# Patient Record
Sex: Male | Born: 1960
Health system: Southern US, Community
[De-identification: ages and names within clinical notes are randomized; demographics above are authoritative.]

## PROBLEM LIST (undated history)

## (undated) DIAGNOSIS — I10 Essential (primary) hypertension: Secondary | ICD-10-CM

## (undated) HISTORY — PX: GASTRIC BYPASS: SHX52

## (undated) HISTORY — PX: CHOLECYSTECTOMY: SHX55

---

## 2015-07-17 DIAGNOSIS — M25531 Pain in right wrist: Secondary | ICD-10-CM | POA: Insufficient documentation

## 2015-07-17 DIAGNOSIS — M7581 Other shoulder lesions, right shoulder: Secondary | ICD-10-CM | POA: Insufficient documentation

## 2015-07-17 DIAGNOSIS — M170 Bilateral primary osteoarthritis of knee: Secondary | ICD-10-CM | POA: Insufficient documentation

## 2015-07-17 DIAGNOSIS — G894 Chronic pain syndrome: Secondary | ICD-10-CM | POA: Insufficient documentation

## 2015-08-17 DIAGNOSIS — I10 Essential (primary) hypertension: Secondary | ICD-10-CM | POA: Insufficient documentation

## 2015-10-09 ENCOUNTER — Encounter (HOSPITAL_BASED_OUTPATIENT_CLINIC_OR_DEPARTMENT_OTHER): Payer: Self-pay | Admitting: *Deleted

## 2015-10-09 ENCOUNTER — Emergency Department (HOSPITAL_BASED_OUTPATIENT_CLINIC_OR_DEPARTMENT_OTHER)
Admission: EM | Admit: 2015-10-09 | Discharge: 2015-10-09 | Disposition: A | Discharge status: Home / Self Care | Payer: Managed Care, Other (non HMO) | Attending: Emergency Medicine | Admitting: Emergency Medicine

## 2015-10-09 ENCOUNTER — Emergency Department (HOSPITAL_BASED_OUTPATIENT_CLINIC_OR_DEPARTMENT_OTHER): Payer: Managed Care, Other (non HMO)

## 2015-10-09 DIAGNOSIS — R002 Palpitations: Secondary | ICD-10-CM | POA: Insufficient documentation

## 2015-10-09 DIAGNOSIS — R072 Precordial pain: Secondary | ICD-10-CM | POA: Diagnosis present

## 2015-10-09 DIAGNOSIS — I10 Essential (primary) hypertension: Secondary | ICD-10-CM | POA: Insufficient documentation

## 2015-10-09 DIAGNOSIS — Z79899 Other long term (current) drug therapy: Secondary | ICD-10-CM | POA: Insufficient documentation

## 2015-10-09 HISTORY — DX: Essential (primary) hypertension: I10

## 2015-10-09 LAB — CBC
HEMATOCRIT: 32.3 % — AB (ref 39.0–52.0)
HEMOGLOBIN: 10.2 g/dL — AB (ref 13.0–17.0)
MCH: 22.4 pg — ABNORMAL LOW (ref 26.0–34.0)
MCHC: 31.6 g/dL (ref 30.0–36.0)
MCV: 71 fL — AB (ref 78.0–100.0)
Platelets: 278 10*3/uL (ref 150–400)
RBC: 4.55 MIL/uL (ref 4.22–5.81)
RDW: 16.4 % — ABNORMAL HIGH (ref 11.5–15.5)
WBC: 4.4 10*3/uL (ref 4.0–10.5)

## 2015-10-09 LAB — BASIC METABOLIC PANEL
ANION GAP: 6 (ref 5–15)
BUN: 18 mg/dL (ref 6–20)
CHLORIDE: 109 mmol/L (ref 101–111)
CO2: 24 mmol/L (ref 22–32)
Calcium: 8.7 mg/dL — ABNORMAL LOW (ref 8.9–10.3)
Creatinine, Ser: 1.06 mg/dL (ref 0.61–1.24)
GFR calc Af Amer: >60 mL/min (ref >60)
GLUCOSE: 104 mg/dL — AB (ref 65–99)
POTASSIUM: 3.1 mmol/L — AB (ref 3.5–5.1)
Sodium: 139 mmol/L (ref 135–145)

## 2015-10-09 LAB — TROPONIN I: Troponin I: <0.03 ng/mL (ref <0.031)

## 2015-10-09 MED ORDER — GI COCKTAIL ~~LOC~~
30.0000 mL | Freq: Once | ORAL | Status: AC
  Administered 2015-10-09: 30 mL via ORAL
  Filled 2015-10-09: qty 30

## 2015-10-09 MED ORDER — POTASSIUM CHLORIDE CRYS ER 20 MEQ PO TBCR
40.0000 meq | EXTENDED_RELEASE_TABLET | Freq: Once | ORAL | Status: AC
  Administered 2015-10-09: 40 meq via ORAL
  Filled 2015-10-09: qty 2

## 2015-10-09 NOTE — ED Notes (Addendum)
Pt reports CP, heart palpitations, diaphoresis, SOB and left arm pain all day today.  Reports taking acid reducers without relief.  Pt in obvious pain in triage.

## 2015-10-09 NOTE — ED Provider Notes (Signed)
CSN: [id number]     Arrival date & time 10/09/15  0020 History   First MD Initiated Contact with Patient 10/09/15 [phone]     Chief Complaint  Patient presents with  . Chest Pain     (Consider location/radiation/quality/duration/timing/severity/associated sxs/prior Treatment) Patient is a 55 y.o. male presenting with chest pain and palpitations. The history is provided by the patient.  Chest Pain Pain location:  Substernal area Pain quality: tightness   Pain radiates to:  Does not radiate Pain radiates to the back: no   Pain severity:  Moderate Onset quality:  Gradual Duration:  1 day Timing:  Constant Progression:  Waxing and waning Chronicity:  New Context: stress   Context: not breathing and no trauma   Relieved by:  Nothing Worsened by:  Nothing tried Ineffective treatments:  None tried Associated symptoms: palpitations   Associated symptoms: no cough, no dysphagia, no fever, no heartburn, no lower extremity edema, no near-syncope, no PND, not vomiting and no weakness   Risk factors: no aortic disease   Palpitations Palpitations quality:  Fast Onset quality:  Gradual Timing:  Constant Progression:  Unchanged Chronicity:  New Context: anxiety   Context: not appetite suppressants and not illicit drugs   Relieved by:  Nothing Worsened by:  Nothing Ineffective treatments:  None tried Associated symptoms: chest pain   Associated symptoms: no cough, no lower extremity edema, no near-syncope, no PND, no vomiting and no weakness   Risk factors: stress   Risk factors: no hx of PE     Past Medical History  Diagnosis Date  . Hypertension    Past Surgical History  Procedure Laterality Date  . Gastric bypass    . Cholecystectomy     History reviewed. No pertinent family history. Social History  Substance Use Topics  . Smoking status: Never Smoker   . Smokeless tobacco: None  . Alcohol Use: No    Review of Systems  Constitutional: Negative for fever.  HENT:  Negative for trouble swallowing.   Respiratory: Negative for cough and wheezing.   Cardiovascular: Positive for chest pain and palpitations. Negative for leg swelling, PND and near-syncope.  Gastrointestinal: Negative for heartburn and vomiting.  Neurological: Negative for weakness.  All other systems reviewed and are negative.     Allergies  Aspirin; Biaxin; Ibuprofen; and Lotensin  Home Medications   Prior to Admission medications   Medication Sig Start Date End Date Taking? Authorizing Provider  alprazolam Prudy Feeler) 2 MG tablet Take 2 mg by mouth at bedtime as needed for sleep.   Yes Historical Provider, MD  HYDROcodone-acetaminophen (NORCO/VICODIN) 5-325 MG tablet Take 1 tablet by mouth every 6 (six) hours as needed for moderate pain.   Yes Historical Provider, MD  losartan-hydrochlorothiazide (HYZAAR) 100-25 MG tablet Take 1 tablet by mouth daily.   Yes Historical Provider, MD  tiZANidine (ZANAFLEX) 4 MG tablet Take 4 mg by mouth every 6 (six) hours as needed for muscle spasms.   Yes Historical Provider, MD   BP 147/95 mmHg  Pulse 60  Temp(Src) 98.3 F (36.8 C) (Oral)  Resp 20  Ht  (1.88 m)  Wt 340 lb (154.223 kg)  BMI 43.63 kg/m2  SpO2 100% Physical Exam  Constitutional: He is oriented to person, place, and time. He appears well-developed and well-nourished.  HENT:  Head: Normocephalic and atraumatic.  Mouth/Throat: Oropharynx is clear and moist.  Eyes: Conjunctivae are normal. Pupils are equal, round, and reactive to light.  Neck: Normal range of motion. Neck supple.  Cardiovascular: Normal rate, regular rhythm and intact distal pulses.   Pulmonary/Chest: Effort normal and breath sounds normal. No respiratory distress. He has no wheezes. He has no rales.  Abdominal: Soft. Bowel sounds are normal. There is no tenderness. There is no rebound and no guarding.  Musculoskeletal: Normal range of motion. He exhibits no edema or tenderness.  Neurological: He is alert and  oriented to person, place, and time. He has normal reflexes.  Skin: Skin is warm and dry. He is not diaphoretic.  Psychiatric: His mood appears anxious.    ED Course  Procedures (including critical care time) Labs Review Labs Reviewed  BASIC METABOLIC PANEL  CBC  TROPONIN I    Imaging Review Dg Chest 2 View  10/09/2015  CLINICAL DATA:  Chest pain EXAM: CHEST  2 VIEW COMPARISON:  None. FINDINGS: Normal heart size and mediastinal contours. No acute infiltrate or edema. Cluster of calcified granulomas suspected in the right mid lung. No effusion or pneumothorax. No acute osseous findings. IMPRESSION: No evidence of acute cardiopulmonary disease. Electronically Signed   By: Marnee Spring M.D.   On: 10/09/2015 00:59   I have personally reviewed and evaluated these images and lab results as part of my medical decision-making.   EKG Interpretation   Date/Time:  Monday October 09 2015 00:26:19 EDT Ventricular Rate:  72 PR Interval:  168 QRS Duration: 114 QT Interval:  412 QTC Calculation: 451 R Axis:   49 Text Interpretation:  Sinus rhythm with Premature atrial complexes Minimal  voltage criteria for LVH, may be normal variant Confirmed by [facility]   MD, Kobe Ofallon ([id number]) on 10/09/2015 1:14:31 AM      MDM   Final diagnoses:  None   Filed Vitals:   10/09/15 0415 10/09/15 0430  BP: 126/84 128/91  Pulse: 64 62  Temp:    Resp:      Results for orders placed or performed during the hospital encounter of 10/09/15  Basic metabolic panel  Result Value Ref Range   Sodium 139 135 - 145 mmol/L   Potassium 3.1 (L) 3.5 - 5.1 mmol/L   Chloride 109 101 - 111 mmol/L   CO2 24 22 - 32 mmol/L   Glucose, Bld 104 (H) 65 - 99 mg/dL   BUN 18 6 - 20 mg/dL   Creatinine, Ser 4.09 0.61 - 1.24 mg/dL   Calcium 8.7 (L) 8.9 - 10.3 mg/dL   GFR calc non Af Amer >60 >60 mL/min   GFR calc Af Amer >60 >60 mL/min   Anion gap 6 5 - 15  CBC  Result Value Ref Range   WBC 4.4 4.0 - 10.5 K/uL   RBC 4.55  4.22 - 5.81 MIL/uL   Hemoglobin 10.2 (L) 13.0 - 17.0 g/dL   HCT 81.1 (L) 91.4 - 78.2 %   MCV 71.0 (L) 78.0 - 100.0 fL   MCH 22.4 (L) 26.0 - 34.0 pg   MCHC 31.6 30.0 - 36.0 g/dL   RDW 95.6 (H) 21.3 - 08.6 %   Platelets 278 150 - 400 K/uL  Troponin I  Result Value Ref Range   Troponin I <0.03 <0.031 ng/mL  Troponin I  Result Value Ref Range   Troponin I <0.03 <0.031 ng/mL   Dg Chest 2 View  10/09/2015  CLINICAL DATA:  Chest pain EXAM: CHEST  2 VIEW COMPARISON:  None. FINDINGS: Normal heart size and mediastinal contours. No acute infiltrate or edema. Cluster of calcified granulomas suspected in the right mid lung. No effusion or  pneumothorax. No acute osseous findings. IMPRESSION: No evidence of acute cardiopulmonary disease. Electronically Signed   By: Marnee SpringJonathon  Watts M.D.   On: 10/09/2015 00:59    PERC negative wells 0 highly doubt PE.  Suspect stress and anxiety as HR is not fast on exam nor at all on the monitor during visit.  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  Ruled out for MI with 2 negative troponins and EKG.  HEART score is 1 and patient is stable for discharge with close follow up.  Strict return precautions given    Shadee Rathod, MD 10/09/15 [phone]

## 2015-10-09 NOTE — Discharge Instructions (Signed)
Holter Monitoring A Holter monitor is a small device that is used to detect abnormal heart rhythms. It clips to your clothing and is connected by wires to flat, sticky disks (electrodes) that attach to your chest. It is worn continuously for 24-48 hours. HOME CARE INSTRUCTIONS  Wear your Holter monitor at all times, even while exercising and sleeping, for as long as directed by your health care provider.  Make sure that the Holter monitor is safely clipped to your clothing or close to your body as recommended by your health care provider.  Do not get the monitor or wires wet.  Do not put body lotion or moisturizer on your chest.  Keep your skin clean.  Keep a diary of your daily activities, such as walking and doing chores. If you feel that your heartbeat is abnormal or that your heart is fluttering or skipping a beat:  Record what you are doing when it happens.  Record what time of day the symptoms occur.  Return your Holter monitor as directed by your health care provider.  Keep all follow-up visits as directed by your health care provider. This is important. SEEK IMMEDIATE MEDICAL CARE IF:  You feel lightheaded or you faint.  You have trouble breathing.  You feel pain in your chest, upper arm, or jaw.  You feel sick to your stomach and your skin is pale, cool, or damp.  You heartbeat feels unusual or abnormal.   This information is not intended to replace advice given to you by your health care provider. Make sure you discuss any questions you have with your health care provider.   Document Released: 01/19/2004 Document Revised: 05/13/2014 Document Reviewed: 11/29/2013 Elsevier Interactive Patient Education 2016 [employer]  

## 2015-10-09 NOTE — ED Notes (Signed)
Pt not in room, pt in b/r.  

## 2015-10-10 DIAGNOSIS — M5416 Radiculopathy, lumbar region: Secondary | ICD-10-CM | POA: Insufficient documentation

## 2015-10-10 DIAGNOSIS — M5412 Radiculopathy, cervical region: Secondary | ICD-10-CM | POA: Insufficient documentation

## 2015-10-13 DIAGNOSIS — D509 Iron deficiency anemia, unspecified: Secondary | ICD-10-CM | POA: Insufficient documentation

## 2015-10-13 DIAGNOSIS — E876 Hypokalemia: Secondary | ICD-10-CM | POA: Insufficient documentation

## 2015-10-13 DIAGNOSIS — R0789 Other chest pain: Secondary | ICD-10-CM | POA: Insufficient documentation

## 2015-10-23 DIAGNOSIS — R002 Palpitations: Secondary | ICD-10-CM | POA: Insufficient documentation

## 2015-11-29 ENCOUNTER — Ambulatory Visit: Payer: Self-pay | Admitting: Allergy and Immunology

## 2015-12-05 DIAGNOSIS — E559 Vitamin D deficiency, unspecified: Secondary | ICD-10-CM | POA: Insufficient documentation

## 2015-12-11 ENCOUNTER — Emergency Department (HOSPITAL_BASED_OUTPATIENT_CLINIC_OR_DEPARTMENT_OTHER)
Admission: EM | Admit: 2015-12-11 | Discharge: 2015-12-11 | Disposition: A | Discharge status: Home / Self Care | Payer: Managed Care, Other (non HMO) | Attending: Emergency Medicine | Admitting: Emergency Medicine

## 2015-12-11 ENCOUNTER — Encounter (HOSPITAL_BASED_OUTPATIENT_CLINIC_OR_DEPARTMENT_OTHER): Payer: Self-pay | Admitting: Emergency Medicine

## 2015-12-11 ENCOUNTER — Emergency Department (HOSPITAL_BASED_OUTPATIENT_CLINIC_OR_DEPARTMENT_OTHER): Payer: Managed Care, Other (non HMO)

## 2015-12-11 DIAGNOSIS — Z79899 Other long term (current) drug therapy: Secondary | ICD-10-CM | POA: Diagnosis not present

## 2015-12-11 DIAGNOSIS — I10 Essential (primary) hypertension: Secondary | ICD-10-CM | POA: Diagnosis not present

## 2015-12-11 DIAGNOSIS — M25561 Pain in right knee: Secondary | ICD-10-CM | POA: Insufficient documentation

## 2015-12-11 MED ORDER — PREDNISONE 10 MG PO TABS: 20.0000 mg | ORAL_TABLET | Freq: Two times a day (BID) | ORAL | 0 refills | Status: AC

## 2015-12-11 MED ORDER — PREDNISONE 50 MG PO TABS
60.0000 mg | ORAL_TABLET | Freq: Once | ORAL | Status: AC
  Administered 2015-12-11: 60 mg via ORAL
  Filled 2015-12-11: qty 1

## 2015-12-11 MED FILL — predniSONE 10 MG TABS: 10 | 5 days supply | Qty: 20 | Fill #0

## 2015-12-11 NOTE — Discharge Instructions (Signed)
You have been seen today for knee pain. Your imaging showed no acute abnormalities, rather than showed arthritic changes that are likely chronic. Follow-up with orthopedics as soon as possible. Follow up with PCP as needed. Return to ED as needed.

## 2015-12-11 NOTE — ED Notes (Signed)
Patient transported to X-ray 

## 2015-12-11 NOTE — ED Triage Notes (Signed)
Patient states that he is having pain to his right knee when he was running and he heard a pop. The patient reports that this was 1 week ago

## 2015-12-11 NOTE — ED Provider Notes (Signed)
[facility]   CSN: [id number] Arrival date & time: 12/11/15  1437  First Provider Contact:  First MD Initiated Contact with Patient 12/11/15 1600        History   Chief Complaint Chief Complaint  Patient presents with  . Leg Pain    HPI Michael Raymond is a 55 y.o. male.  HPI   Michael Raymond is a 55 y.o. male, with a history of chronic bilateral knee pain, presenting to the ED with anterior right knee pain. Moderate to severe, burning, nonradiating. Patient states that he has been trying to lose weight, was running last week, and heard a "pop." Pain is worse while lying down. Has tried his vicodin, voltaren gel, and gabapentin. Saw PCP day afterward. Duplex ultrasound and labs ok. Patient adds that he has had multiple arthroscopy procedures to the knee in question. Has been told that he needs a knee replacement but his surgeon wants to wait until the patient is at least 60. Has a pain mgt specialist. Does not want pain meds. Patient denies fever/chills, neuro deficits, swelling, or any other complaints.     Past Medical History:  Diagnosis Date  . Hypertension     There are no active problems to display for this patient.   Past Surgical History:  Procedure Laterality Date  . CHOLECYSTECTOMY    . GASTRIC BYPASS         Home Medications    Prior to Admission medications   Medication Sig Start Date End Date Taking? Authorizing Provider  alprazolam Prudy Feeler(XANAX) 2 MG tablet Take 2 mg by mouth at bedtime as needed for sleep.    Historical Provider, MD  HYDROcodone-acetaminophen (NORCO/VICODIN) 5-325 MG tablet Take 1 tablet by mouth every 6 (six) hours as needed for moderate pain.    Historical Provider, MD  losartan-hydrochlorothiazide (HYZAAR) 100-25 MG tablet Take 1 tablet by mouth daily.    Historical Provider, MD  predniSONE (DELTASONE) 10 MG tablet Take 2 tablets (20 mg total) by mouth 2 (two) times daily with a meal. 12/11/15   [place] Petron C Jamel Dunton, PA-C    tiZANidine (ZANAFLEX) 4 MG tablet Take 4 mg by mouth every 6 (six) hours as needed for muscle spasms.    Historical Provider, MD    Family History History reviewed. No pertinent family history.  Social History Social History  Substance Use Topics  . Smoking status: Never Smoker  . Smokeless tobacco: Never Used  . Alcohol use No     Allergies   Aspirin; Biaxin [clarithromycin]; Ibuprofen; and Lotensin [benazepril hcl]   Review of Systems Review of Systems  Constitutional: Negative for chills and fever.  Musculoskeletal: Positive for arthralgias (right knee). Negative for joint swelling.  Neurological: Negative for weakness and numbness.     Physical Exam Updated Vital Signs BP 153/85 (BP Location: Right Arm)   Pulse 80   Temp 98.5 F (36.9 C) (Oral)   Resp 18   Ht 6\' 2"  (1.88 m)   Wt (!) 152 kg   SpO2 100%   BMI 43.01 kg/m   Physical Exam  Constitutional: He appears well-developed and well-nourished. No distress.  HENT:  Head: Normocephalic and atraumatic.  Eyes: Conjunctivae are normal.  Neck: Neck supple.  Cardiovascular: Normal rate and regular rhythm.   Pulmonary/Chest: Effort normal.  Musculoskeletal: He exhibits no edema.  Anterior right knee tenderness. Patella is in place. Full range of motion without pain. Patient is readily weightbearing. No swelling, crepitus, laxity, or discernible effusion.  Neurological: He is alert.  Skin: Skin is warm and dry. He is not diaphoretic.  Psychiatric: He has a normal mood and affect. His behavior is normal.  Nursing note and vitals reviewed.    ED Treatments / Results  Labs (all labs ordered are listed, but only abnormal results are displayed) Labs Reviewed - No data to display  EKG  EKG Interpretation None       Radiology Dg Knee Complete 4 Views Right  Result Date: 12/11/2015 CLINICAL DATA:  Running 1 week ago and felt a pop in right knee. Medial lateral and proximal anterior pain. EXAM: RIGHT KNEE  - COMPLETE 4+ VIEW COMPARISON:  None. FINDINGS: No fracture. No subluxation. No joint effusion. Mild loss of joint space noted medial compartment. Prominent hypertrophic spurring is visible in all 3 compartments. IMPRESSION: Tricompartmental degenerative changes without acute bony findings. Electronically Signed   By: Kennith Center M.D.   On: 12/11/2015 16:39    Procedures Procedures (including critical care time)  Medications Ordered in ED Medications  predniSONE (DELTASONE) tablet 60 mg (60 mg Oral Given 12/11/15 1620)     Initial Impression / Assessment and Plan / ED Course  I have reviewed the triage vital signs and the nursing notes.  Pertinent labs & imaging results that were available during my care of the patient were reviewed by me and considered in my medical decision making (see chart for details).  Clinical Course    Michael Raymond presents with right knee pain for the last week.  Patient likely has pain due to arthritic degeneration. Patient already has a knee brace that he states is adequate for support. Patient is readily weightbearing. No acute abnormalities on x-ray. Orthopedic follow-up. The patient was given instructions for home care as well as return precautions. Patient voices understanding of these instructions, accepts the plan, and is comfortable with discharge.  Vitals:   12/11/15 1447 12/11/15 1713  BP: 153/85 148/88  Pulse: 80 80  Resp: 18 18  Temp: 98.5 F (36.9 C) 98 F (36.7 C)  TempSrc: Oral Oral  SpO2: 100% 99%  Weight: (!) 152 kg   Height:  (1.88 m)      Final Clinical Impressions(s) / ED Diagnoses   Final diagnoses:  Right knee pain    Course of prednisone  New Prescriptions Discharge Medication List as of 12/11/2015  5:01 PM    START taking these medications   Details  predniSONE (DELTASONE) 10 MG tablet Take 2 tablets (20 mg total) by mouth 2 (two) times daily with a meal., Starting Mon 12/11/2015, Print         Anselm Pancoast, PA-C 12/12/15 1535    Doug Sou, MD 12/12/15 1842

## 2015-12-11 NOTE — ED Notes (Addendum)
Pt returns from Xray.

## 2016-04-02 ENCOUNTER — Ambulatory Visit (INDEPENDENT_AMBULATORY_CARE_PROVIDER_SITE_OTHER): Payer: Self-pay | Admitting: Podiatry

## 2016-04-02 ENCOUNTER — Encounter: Payer: Self-pay | Admitting: Podiatry

## 2016-04-02 DIAGNOSIS — L84 Corns and callosities: Secondary | ICD-10-CM

## 2016-04-02 DIAGNOSIS — S90222A Contusion of left lesser toe(s) with damage to nail, initial encounter: Secondary | ICD-10-CM

## 2016-04-02 NOTE — Progress Notes (Signed)
   Subjective:    Patient ID: Michael Raymond, male    DOB: 1960-05-16, 55 y.o.   MRN: [id number]  HPI  55 year old male presents the also concerns of his left big toenail becoming tender on the top of the nail which is been on that the last 2-3 weeks. He has noticed a spot in the center of his toenail but is unsure how long its been there. Denies any swelling or redness or any drains, the toenail. He said no recent treatment for this. Also he has a callus of the ball of the right foot which is painful to pressure in shoe gear. Denies he swelling or redness or any drainage. No treatment. No other complaints. He denies any other issues with his toenails her fingernails. He denies any systemic complaints as fevers, chills, nausea, vomiting. No calf pain, chest pain, shortness of breath. No cardiac issues other than high blood pressure which is controlled and he takes medicine.   Review of Systems  All other systems reviewed and are negative.      Objective:   Physical Exam General: AAO x3, NAD  Dermatological: On the distal aspect of the left hallux toenails what appears to be a very small amount of some ongoing hematoma/old blood. There is mild tenderness to palpation on the dorsal aspect of the toenail distally. There is no edema, erythema, drainage or pus. There is no pain of the nail borders of the proximal nail border. Hyperkeratotic lesion present right foot sub-metatarsal 5. Upon debridement no underlying ulceration, drainage or any signs of infection. No other open lesions or pre-ulcerative lesions are identified today.  Vascular: Dorsalis Pedis artery and Posterior Tibial artery pedal pulses are 2/4 bilateral with immedate capillary fill time. PThere is no pain with calf compression, swelling, warmth, erythema.   Neruologic: Grossly intact via light touch bilateral. Vibratory intact via tuning fork bilateral. Protective threshold with Semmes Wienstein monofilament intact to all pedal sites  bilateral.   Musculoskeletal: Prominent metatarsal heads plantarly and atrophy of fat pad. No gross boney pedal deformities bilateral. No pain, crepitus, or limitation noted with foot and ankle range of motion bilateral. Muscular strength 5/5 in all groups tested bilateral.  Gait: Unassisted, Nonantalgic.      Assessment & Plan:  55 year old male left toenail cephalhematoma -Treatment options discussed including all alternatives, risks, and complications -Etiology of symptoms were discussed -Hyperkeratotic lesion debrided 1 without complications or bleeding to the right foot. Metatarsal offloading after dispensed. -Left hallux toenail is debrided. The nails firmly adhered to the nail but there is no signs of infection. Discussed them to allow the toenail to grow out. Offloading pads were also dispensed. He has no symptoms of any other systemic process. Discussed them toenail avulsion/biopsy but he wishes to hold off on that. -Follow up with symptoms not resolve the next 6-8 weeks or sooner if needed. Call any questions or concerns meantime.  Ovid CurdMatthew Wagoner, DPM

## 2016-10-18 DIAGNOSIS — Z9181 History of falling: Secondary | ICD-10-CM | POA: Insufficient documentation

## 2017-02-06 DIAGNOSIS — M1711 Unilateral primary osteoarthritis, right knee: Secondary | ICD-10-CM | POA: Insufficient documentation

## 2017-02-06 DIAGNOSIS — M1712 Unilateral primary osteoarthritis, left knee: Secondary | ICD-10-CM | POA: Insufficient documentation

## 2017-02-06 DIAGNOSIS — I10 Essential (primary) hypertension: Secondary | ICD-10-CM | POA: Insufficient documentation

## 2017-06-24 IMAGING — DX DG KNEE COMPLETE 4+V*R*
4 series · 4 of 4 positions shown · non-contrast
Comparison: None.

CLINICAL DATA: Running 1 week ago and felt a pop in right knee.
Medial lateral and proximal anterior pain.

EXAM:
RIGHT KNEE - COMPLETE 4+ VIEW

[knee ap]
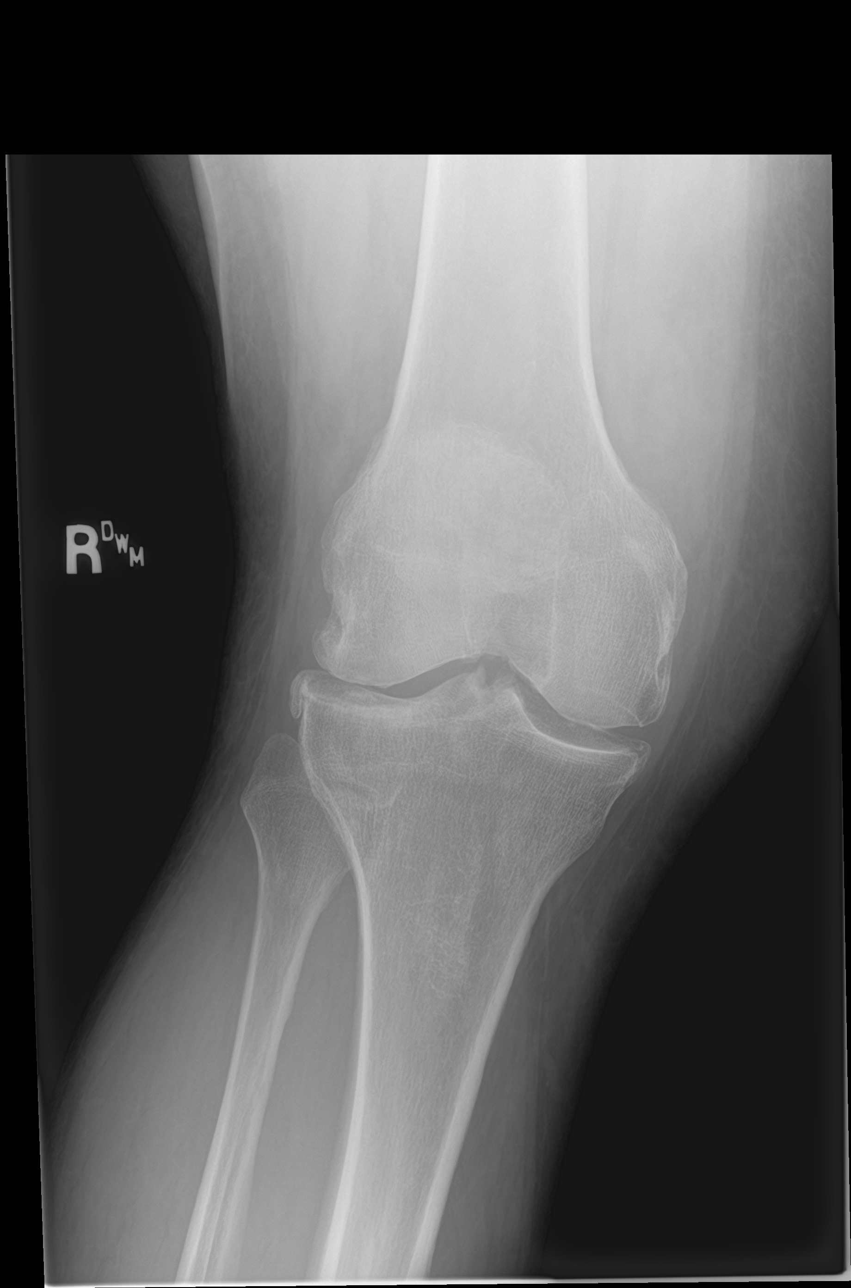

[knee lat]
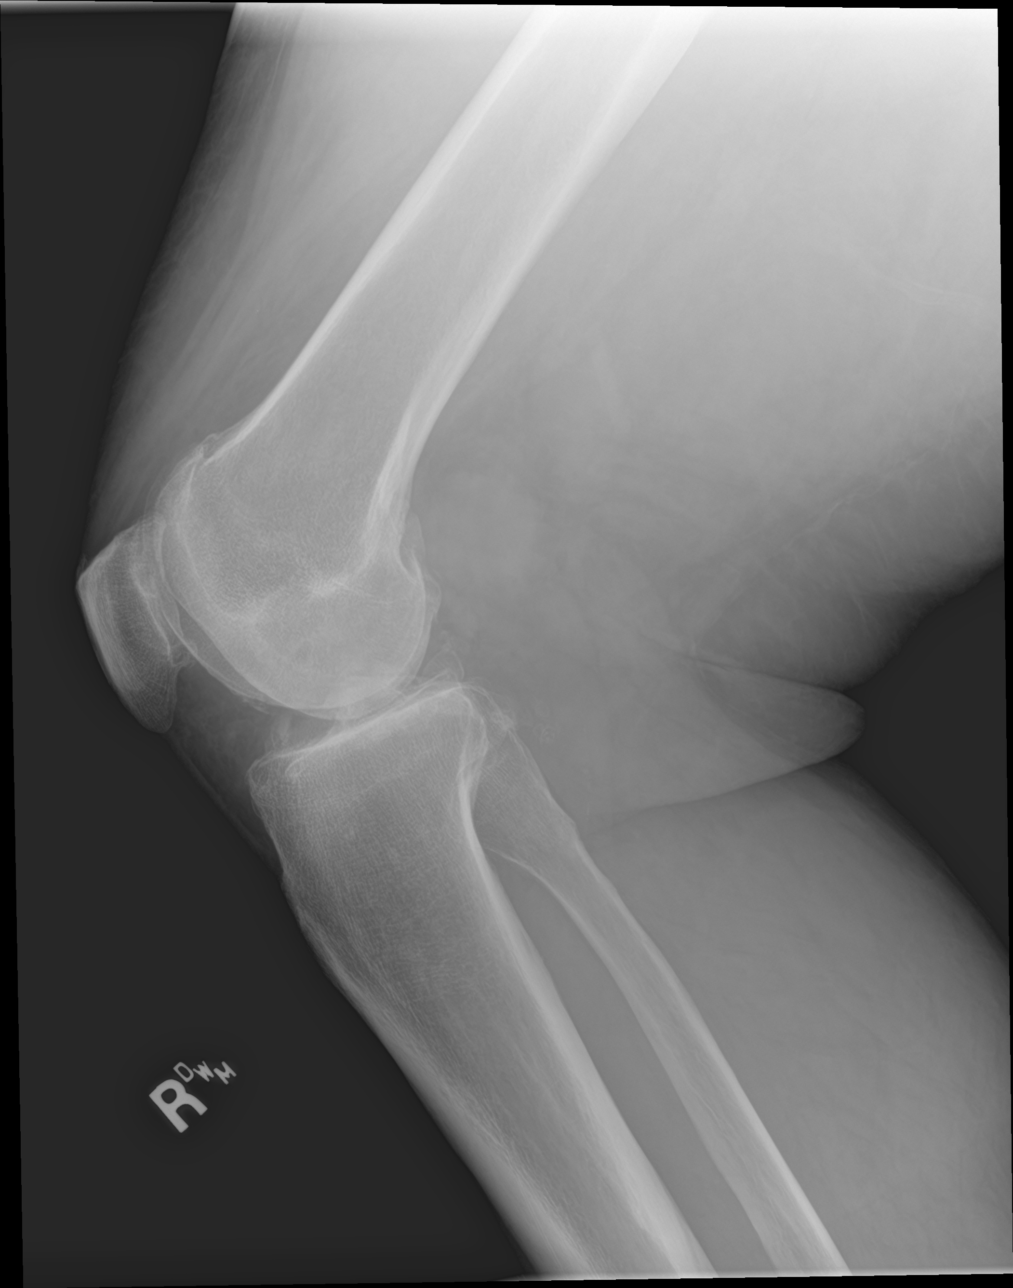

[knee obl (1 of 2)]
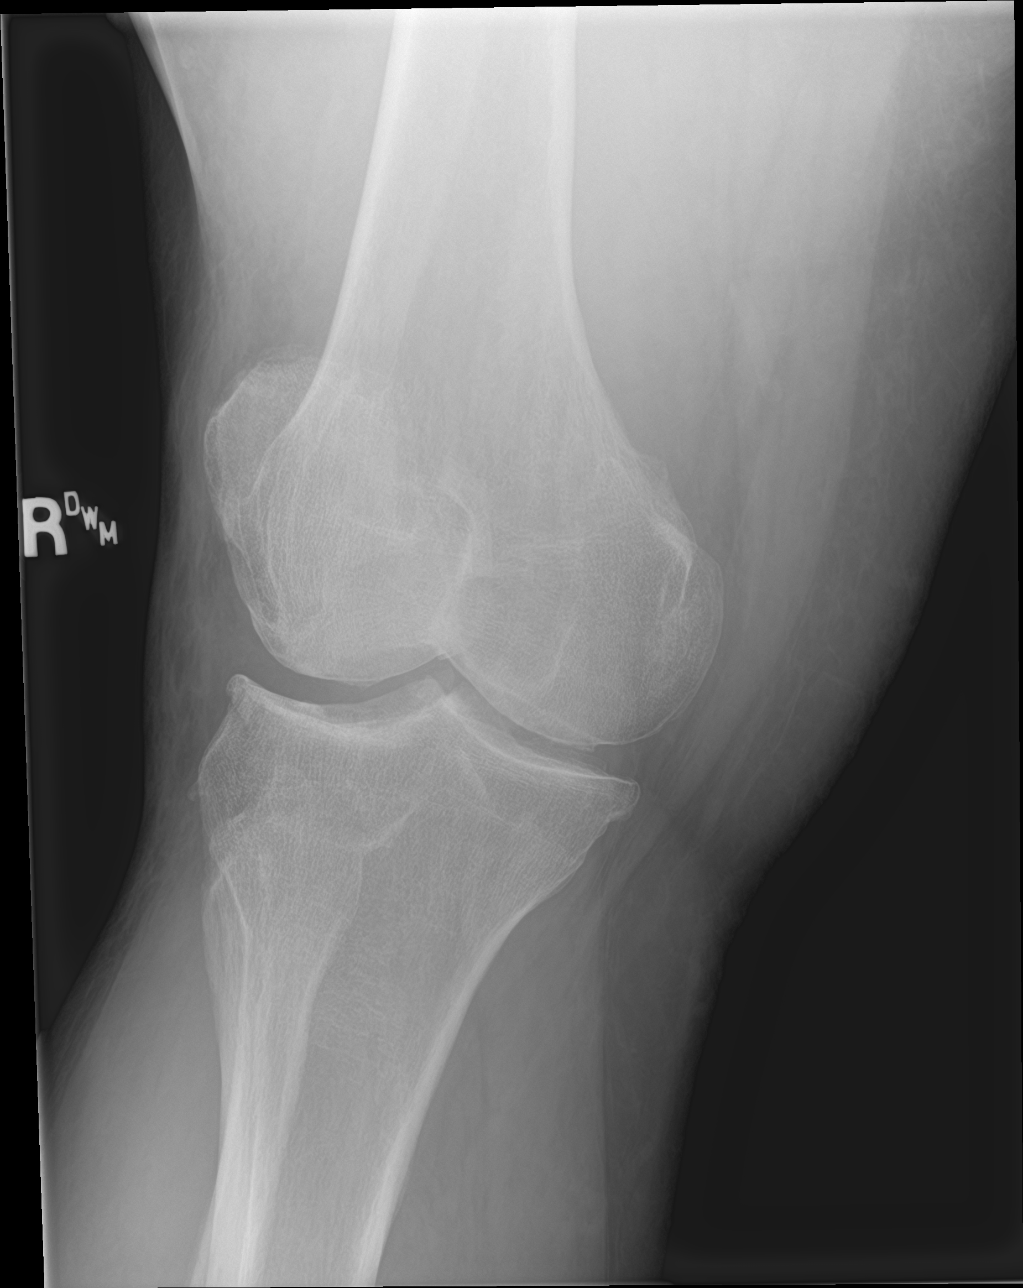

[knee obl (2 of 2)]
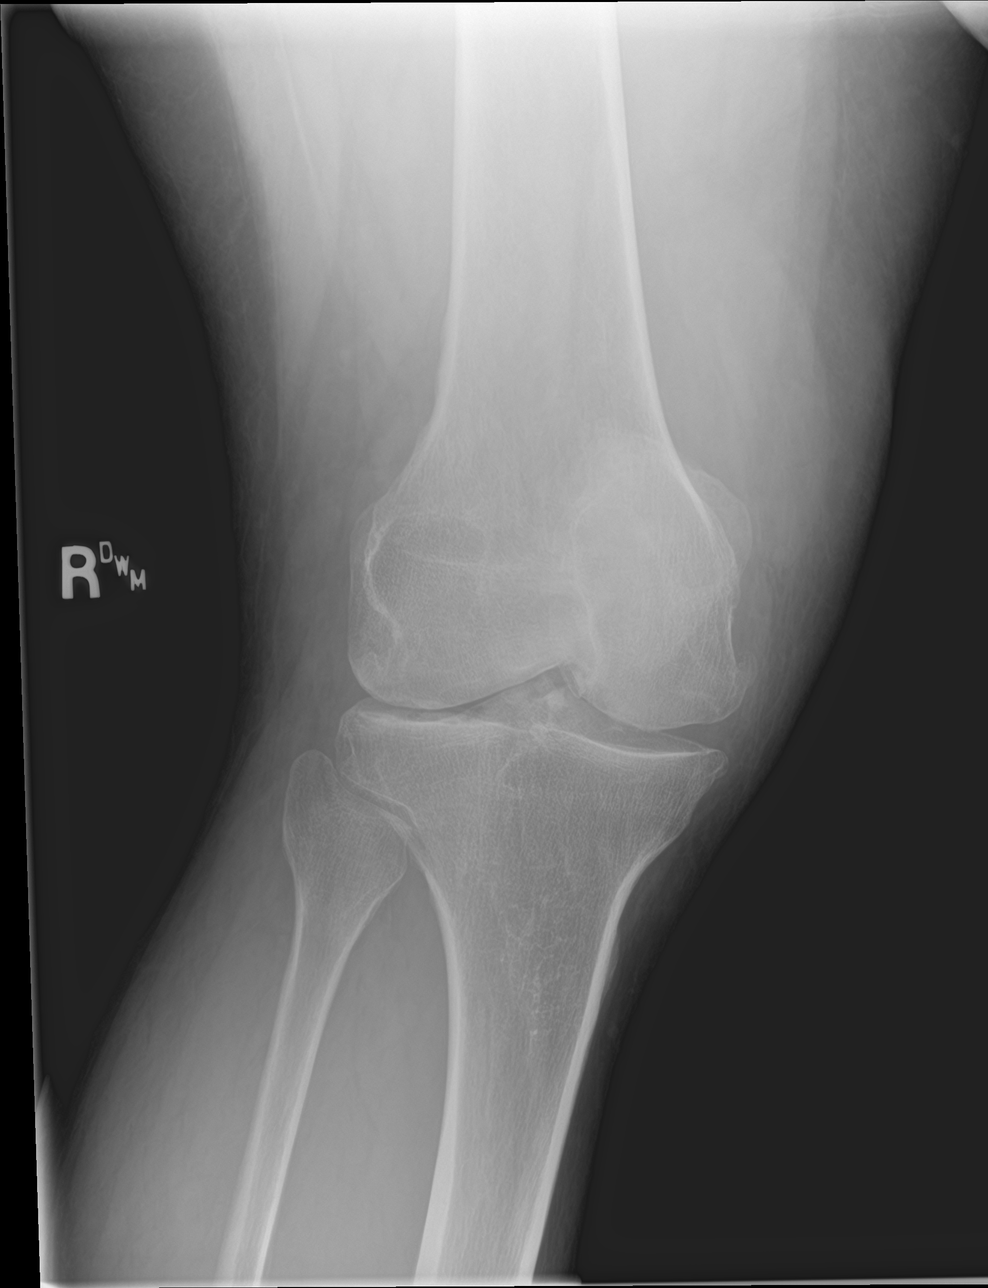

[4 of 4 positions shown; findings below may reference images not displayed]

FINDINGS: No fracture. No subluxation. No joint effusion. Mild loss of joint
space noted medial compartment. Prominent hypertrophic spurring is
visible in all 3 compartments.
IMPRESSION: Tricompartmental degenerative changes without acute bony findings.

## 2017-11-25 ENCOUNTER — Other Ambulatory Visit: Payer: Self-pay | Admitting: Podiatry

## 2017-11-25 ENCOUNTER — Ambulatory Visit (INDEPENDENT_AMBULATORY_CARE_PROVIDER_SITE_OTHER): Payer: BLUE CROSS/BLUE SHIELD

## 2017-11-25 ENCOUNTER — Encounter: Payer: Self-pay | Admitting: Podiatry

## 2017-11-25 ENCOUNTER — Other Ambulatory Visit: Payer: Self-pay

## 2017-11-25 ENCOUNTER — Ambulatory Visit: Payer: BLUE CROSS/BLUE SHIELD | Admitting: Podiatry

## 2017-11-25 DIAGNOSIS — M7752 Other enthesopathy of left foot: Secondary | ICD-10-CM

## 2017-11-25 DIAGNOSIS — M79675 Pain in left toe(s): Secondary | ICD-10-CM

## 2017-11-25 DIAGNOSIS — M779 Enthesopathy, unspecified: Secondary | ICD-10-CM

## 2017-11-25 DIAGNOSIS — M79674 Pain in right toe(s): Secondary | ICD-10-CM

## 2017-11-25 DIAGNOSIS — M7751 Other enthesopathy of right foot: Secondary | ICD-10-CM

## 2017-11-25 DIAGNOSIS — L84 Corns and callosities: Secondary | ICD-10-CM

## 2017-11-25 DIAGNOSIS — B351 Tinea unguium: Secondary | ICD-10-CM | POA: Diagnosis not present

## 2017-11-25 DIAGNOSIS — Q828 Other specified congenital malformations of skin: Secondary | ICD-10-CM | POA: Diagnosis not present

## 2017-11-25 MED ORDER — DICLOFENAC SODIUM 1 % TD GEL: 2.0000 g | Freq: Four times a day (QID) | TRANSDERMAL | 2 refills | Status: AC

## 2017-11-25 MED ORDER — DEXAMETHASONE SODIUM PHOSPHATE 120 MG/30ML IJ SOLN
4.0000 mg | Freq: Once | INTRAMUSCULAR | Status: AC
  Administered 2017-11-25: 4 mg via INTRA_ARTICULAR

## 2017-11-25 NOTE — Progress Notes (Signed)
Subjective: 57 year old 39male presents the office today for concerns of bilateral foot pain with the right side worse than left he points the fifth metatarsal base but is majority of symptoms.  He states this been ongoing for last 2 to 3 months but is getting worse.  He states it hurts a lot when he puts a lot of pressure and weight to his foot after prolonged walking.  He does stand for quite a long time at work.  No recent injury or trauma to his feet.  He also states is a painful callus on the left foot pain wants to have trimmed on the fifth toe as well as some toenail issues that cause pain occasionally. Denies any systemic complaints such as fevers, chills, nausea, vomiting. No acute changes since last appointment, and no other complaints at this time.   Objective: AAO x3, NAD DP/PT pulses palpable bilaterally, CRT less than 3 seconds There is tenderness palpation of the fifth metatarsal base on the right foot there is some mild swelling to the area compared to contralateral extremity.  No significant discomfort on the peroneal tendon except on the insertion of the fifth metatarsal base.  There is no pain to vibratory sensation to the fifth metatarsal base.  Also minimal discomfort on the left side but no significant the right side.  There is a decrease in medial arch upon weightbearing.  There is no other areas of tenderness identified today other than a hyperkeratotic lesion to the left fifth toe.  Upon debridement of this lesion there is no underlying ulceration drainage or any signs of infection.  Nails appearing somewhat dystrophic, discolored and elongated causing some pain at times but currently not experiencing any pain.  There is no redness or drainage or any swelling to the toenail sites.  No open lesions or pre-ulcerative lesions.  No pain with calf compression, swelling, warmth, erythema  Assessment: Insertional peroneal tendinitis left fifth metatarsal pain right foot worse than left;  symptomatic hyperkeratotic lesion left fifth toe; onychomycosis  Plan: -All treatment options discussed with the patient including all alternatives, risks, complications.   -X-rays obtained reviewed.  No redness of acute fracture or stress fracture identified today. -Discussed a steroid injection on the right foot on the fifth metatarsal base and he wished to proceed.  See procedure note below. -Voltaren gel prescribed he has had this previously without any problems. -Ice to the area daily. -I debrided the hyperkeratotic lesion left fifth toe without any complications I dispensed offloading pads. -As a courtesy I did debride his toenails today under the or causing some subjective pain but currently not expensing this.  Will monitor. -Patient encouraged to call the office with any questions, concerns, change in symptoms.   Procedure: Injection Tendon/Ligament Discussed alternatives, risks, complications and verbal consent was obtained.  Location: Right fifth metatarsal base Skin Prep: Alcohol. Injectate: 0.5cc 0.5% marcaine plain, 0.5 cc 2% lidocaine plain and, 1 cc kenalog 10. Disposition: Patient tolerated procedure well. Injection site dressed with a band-aid.  Post-injection care was discussed and return precautions discussed.    Vivi BarrackMatthew R Wagoner DPM

## 2017-12-25 ENCOUNTER — Ambulatory Visit: Payer: BLUE CROSS/BLUE SHIELD | Admitting: Podiatry

## 2017-12-25 ENCOUNTER — Other Ambulatory Visit: Payer: BLUE CROSS/BLUE SHIELD | Admitting: Orthotics
